# Patient Record
Sex: Female | Born: 1975 | Race: White | Hispanic: No | Marital: Married | Smoking: Never smoker
Health system: Southern US, Community
[De-identification: ages and names within clinical notes are randomized; demographics above are authoritative.]

---

## 2005-05-25 ENCOUNTER — Emergency Department: Payer: Self-pay | Admitting: Emergency Medicine

## 2005-05-26 ENCOUNTER — Emergency Department: Payer: Self-pay | Admitting: Emergency Medicine

## 2005-05-26 ENCOUNTER — Other Ambulatory Visit: Payer: Self-pay

## 2007-01-12 ENCOUNTER — Encounter: Admission: RE | Admit: 2007-01-12 | Discharge: 2007-01-12 | Payer: Self-pay | Admitting: Dermatology

## 2008-09-10 ENCOUNTER — Ambulatory Visit: Payer: Self-pay

## 2008-09-24 ENCOUNTER — Ambulatory Visit: Payer: Self-pay

## 2009-03-25 ENCOUNTER — Ambulatory Visit: Payer: Self-pay

## 2010-04-21 ENCOUNTER — Ambulatory Visit: Payer: Self-pay

## 2012-10-26 ENCOUNTER — Ambulatory Visit: Payer: Self-pay | Admitting: Obstetrics & Gynecology

## 2016-09-02 ENCOUNTER — Other Ambulatory Visit: Payer: Self-pay | Admitting: Family Medicine

## 2016-09-02 DIAGNOSIS — Z1231 Encounter for screening mammogram for malignant neoplasm of breast: Secondary | ICD-10-CM

## 2016-09-28 ENCOUNTER — Encounter: Payer: Self-pay | Admitting: Radiology

## 2016-09-28 ENCOUNTER — Ambulatory Visit
Admission: RE | Admit: 2016-09-28 | Discharge: 2016-09-28 | Disposition: A | Discharge status: Home / Self Care | Payer: Managed Care, Other (non HMO) | Source: Ambulatory Visit | Attending: Family Medicine | Admitting: Family Medicine

## 2016-09-28 DIAGNOSIS — Z1231 Encounter for screening mammogram for malignant neoplasm of breast: Secondary | ICD-10-CM | POA: Insufficient documentation

## 2016-10-02 ENCOUNTER — Other Ambulatory Visit: Payer: Self-pay | Admitting: *Deleted

## 2016-10-02 ENCOUNTER — Inpatient Hospital Stay
Admission: RE | Admit: 2016-10-02 | Discharge: 2016-10-02 | Disposition: A | Discharge status: Home / Self Care | Payer: Self-pay | Source: Ambulatory Visit | Attending: *Deleted | Admitting: *Deleted

## 2016-10-02 DIAGNOSIS — Z9289 Personal history of other medical treatment: Secondary | ICD-10-CM

## 2017-09-06 ENCOUNTER — Other Ambulatory Visit: Payer: Self-pay | Admitting: Family Medicine

## 2017-09-06 ENCOUNTER — Other Ambulatory Visit (HOSPITAL_COMMUNITY): Payer: Self-pay | Admitting: Family Medicine

## 2017-09-06 DIAGNOSIS — E049 Nontoxic goiter, unspecified: Secondary | ICD-10-CM

## 2017-09-13 ENCOUNTER — Ambulatory Visit
Admission: RE | Admit: 2017-09-13 | Discharge: 2017-09-13 | Disposition: A | Discharge status: Home / Self Care | Payer: Managed Care, Other (non HMO) | Source: Ambulatory Visit | Attending: Family Medicine | Admitting: Family Medicine

## 2017-09-13 DIAGNOSIS — E049 Nontoxic goiter, unspecified: Secondary | ICD-10-CM | POA: Insufficient documentation

## 2017-11-17 ENCOUNTER — Other Ambulatory Visit: Payer: Self-pay | Admitting: Pediatrics

## 2017-11-17 DIAGNOSIS — Z1231 Encounter for screening mammogram for malignant neoplasm of breast: Secondary | ICD-10-CM

## 2017-11-29 ENCOUNTER — Ambulatory Visit
Admission: RE | Admit: 2017-11-29 | Discharge: 2017-11-29 | Disposition: A | Discharge status: Home / Self Care | Payer: Managed Care, Other (non HMO) | Source: Ambulatory Visit | Attending: Pediatrics | Admitting: Pediatrics

## 2017-11-29 DIAGNOSIS — Z1231 Encounter for screening mammogram for malignant neoplasm of breast: Secondary | ICD-10-CM | POA: Diagnosis not present

## 2018-11-23 ENCOUNTER — Telehealth: Payer: Self-pay | Admitting: Obstetrics & Gynecology

## 2018-11-23 NOTE — Telephone Encounter (Signed)
[facility] referring Abnormal pap HGSIL, CIN2. Called and left voicemail for patient to call back to be schedule

## 2018-12-07 ENCOUNTER — Telehealth: Payer: Self-pay

## 2018-12-07 NOTE — Telephone Encounter (Signed)
Pt calling; is scheduled for LEEP Mon; started period today; can LEEP still be done or does she need to reschedule?  [phone]

## 2018-12-08 NOTE — Telephone Encounter (Signed)
Pt should not come in for a LEEP on her period this needs to be rescheduled please per [facility]

## 2018-12-12 ENCOUNTER — Ambulatory Visit: Payer: Self-pay | Admitting: Obstetrics and Gynecology

## 2018-12-12 NOTE — Telephone Encounter (Signed)
Patient is reschedule for Colpo appointment 01/09/19

## 2019-01-09 ENCOUNTER — Ambulatory Visit (INDEPENDENT_AMBULATORY_CARE_PROVIDER_SITE_OTHER): Payer: Managed Care, Other (non HMO) | Admitting: Obstetrics and Gynecology

## 2019-01-09 ENCOUNTER — Other Ambulatory Visit: Payer: Self-pay

## 2019-01-09 ENCOUNTER — Other Ambulatory Visit (HOSPITAL_COMMUNITY)
Admission: RE | Admit: 2019-01-09 | Discharge: 2019-01-09 | Disposition: A | Discharge status: Home / Self Care | Payer: Managed Care, Other (non HMO) | Source: Ambulatory Visit | Attending: Obstetrics and Gynecology | Admitting: Obstetrics and Gynecology

## 2019-01-09 ENCOUNTER — Encounter: Payer: Self-pay | Admitting: Obstetrics and Gynecology

## 2019-01-09 VITALS — BP 120/80 | HR 75 | Ht 63.0 in | Wt 169.0 lb

## 2019-01-09 DIAGNOSIS — N870 Mild cervical dysplasia: Secondary | ICD-10-CM | POA: Diagnosis not present

## 2019-01-09 DIAGNOSIS — R87613 High grade squamous intraepithelial lesion on cytologic smear of cervix (HGSIL): Secondary | ICD-10-CM

## 2019-01-09 NOTE — Progress Notes (Signed)
   GYNECOLOGY CLINIC COLPOSCOPY PROCEDURE NOTE  43 y.o. [id number] here for colposcopy for high-grade squamous intraepithelial neoplasia  (HGSIL-encompassing moderate and severe dysplasia)  pap smear on 11/14/2018. Discussed underlying role for HPV infection in the development of cervical dysplasia, its natural history and progression/regression, need for surveillance.  Is the patient  pregnant: No LMP: Patient's last menstrual period was 01/05/2019 (exact date). Smoking status:  reports that she has never smoked. She has never used smokeless tobacco. Contraception: none, withdrawl Future fertility desired:  No  Patient given informed consent, signed copy in the chart, time out was performed.  The patient was position in dorsal lithotomy position. Speculum was placed the cervix was visualized.   After application of acetic acid and then lugols solution colposcopic inspection of the cervix was undertaken.   Colposcopy adequate, full visualization of transformation zone: No abnormal vessels noted at 12 o'clock; corresponding biopsies obtained.   ECC specimen obtained:  Yes  All specimens were labeled and sent to pathology.   Patient was given post procedure instructions.  Will follow up pathology and manage accordingly.  Routine preventative health maintenance measures emphasized.  Physical Exam Genitourinary:        Genitourinary Comments: Small vessel at 12 o'clock     [place], [facility] 01/09/2019 10:13 AM

## 2019-01-09 NOTE — Patient Instructions (Signed)
Colposcopy, Care After This sheet gives you information about how to care for yourself after your procedure. Your doctor may also give you more specific instructions. If you have problems or questions, contact your doctor. What can I expect after the procedure? If you did not have a tissue sample removed (did not have a biopsy), you may only have some spotting for a few days. You can go back to your normal activities. If you had a tissue sample removed, it is common to have:  Soreness and pain. This may last for a few days.  Light-headedness.  Mild bleeding from your vagina or dark-colored, grainy discharge from your vagina. This may last for a few days. You may need to wear a sanitary pad.  Spotting for at least 48 hours after the procedure. Follow these instructions at home:   Take over-the-counter and prescription medicines only as told by your doctor. Ask your doctor what medicines you can start taking again. This is very important if you take blood-thinning medicine.  Do not drive or use heavy machinery while taking prescription pain medicine.  For 3 days, or as long as your doctor tells you, avoid: ? Douching. ? Using tampons. ? Having sex.  If you use birth control (contraception), keep using it.  Limit activity for the first day after the procedure. Ask your doctor what activities are safe for you.  It is up to you to get the results of your procedure. Ask your doctor when your results will be ready.  Keep all follow-up visits as told by your doctor. This is important. Contact a doctor if:  You get a skin rash. Get help right away if:  You are bleeding a lot from your vagina. It is a lot of bleeding if you are using more than one pad an hour for 2 hours in a row.  You have clumps of blood (blood clots) coming from your vagina.  You have a fever.  You have chills  You have pain in your lower belly (pelvic area).  You have signs of infection, such as vaginal  discharge that is: ? Different than usual. ? Yellow. ? Bad-smelling.  You have very pain or cramps in your lower belly that do not get better with medicine.  You feel light-headed.  You feel dizzy.  You pass out (faint). Summary  If you did not have a tissue sample removed (did not have a biopsy), you may only have some spotting for a few days. You can go back to your normal activities.  If you had a tissue sample removed, it is common to have mild pain and spotting for 48 hours.  For 3 days, or as long as your doctor tells you, avoid douching, using tampons and having sex.  Get help right away if you have bleeding, very bad pain, or signs of infection. This information is not intended to replace advice given to you by your health care provider. Make sure you discuss any questions you have with your health care provider. Document Released: 11/18/2007 Document Revised: 05/14/2017 Document Reviewed: 02/19/2016 Elsevier Patient Education  2020 Elsevier Inc.  

## 2019-01-12 NOTE — Progress Notes (Signed)
Will need pap smear in 12 and 24 months. Called 01/12/2019 and left message

## 2019-01-13 ENCOUNTER — Telehealth: Payer: Self-pay

## 2019-01-13 NOTE — Telephone Encounter (Signed)
Called and left message. Did not answer.

## 2019-01-13 NOTE — Telephone Encounter (Signed)
Pt calling Dr. Gilman Schmidt back about 01/09/2019 Test Results.

## 2019-01-19 ENCOUNTER — Other Ambulatory Visit: Payer: Self-pay | Admitting: Pediatrics

## 2019-01-19 DIAGNOSIS — Z1231 Encounter for screening mammogram for malignant neoplasm of breast: Secondary | ICD-10-CM

## 2019-04-10 ENCOUNTER — Ambulatory Visit
Admission: RE | Admit: 2019-04-10 | Discharge: 2019-04-10 | Disposition: A | Discharge status: Home / Self Care | Payer: Managed Care, Other (non HMO) | Source: Ambulatory Visit | Attending: Pediatrics | Admitting: Pediatrics

## 2019-04-10 DIAGNOSIS — Z1231 Encounter for screening mammogram for malignant neoplasm of breast: Secondary | ICD-10-CM | POA: Insufficient documentation

## 2020-10-02 ENCOUNTER — Other Ambulatory Visit: Payer: Self-pay | Admitting: Family Medicine

## 2020-10-02 ENCOUNTER — Other Ambulatory Visit: Payer: Self-pay | Admitting: Pediatrics

## 2020-10-02 DIAGNOSIS — Z1231 Encounter for screening mammogram for malignant neoplasm of breast: Secondary | ICD-10-CM

## 2020-11-04 ENCOUNTER — Ambulatory Visit
Admission: RE | Admit: 2020-11-04 | Discharge: 2020-11-04 | Disposition: A | Discharge status: Home / Self Care | Payer: Managed Care, Other (non HMO) | Source: Ambulatory Visit | Attending: Family Medicine | Admitting: Family Medicine

## 2020-11-04 ENCOUNTER — Other Ambulatory Visit: Payer: Self-pay

## 2020-11-04 DIAGNOSIS — Z1231 Encounter for screening mammogram for malignant neoplasm of breast: Secondary | ICD-10-CM | POA: Diagnosis not present

## 2021-03-31 ENCOUNTER — Other Ambulatory Visit: Payer: Self-pay

## 2021-03-31 ENCOUNTER — Other Ambulatory Visit (HOSPITAL_COMMUNITY)
Admission: RE | Admit: 2021-03-31 | Discharge: 2021-03-31 | Disposition: A | Discharge status: Home / Self Care | Payer: Managed Care, Other (non HMO) | Source: Ambulatory Visit | Attending: Obstetrics and Gynecology | Admitting: Obstetrics and Gynecology

## 2021-03-31 ENCOUNTER — Encounter: Payer: Self-pay | Admitting: Obstetrics and Gynecology

## 2021-03-31 ENCOUNTER — Ambulatory Visit: Payer: Managed Care, Other (non HMO) | Admitting: Obstetrics and Gynecology

## 2021-03-31 VITALS — BP 116/70 | Ht 63.0 in | Wt 179.4 lb

## 2021-03-31 DIAGNOSIS — E039 Hypothyroidism, unspecified: Secondary | ICD-10-CM | POA: Diagnosis not present

## 2021-03-31 DIAGNOSIS — D500 Iron deficiency anemia secondary to blood loss (chronic): Secondary | ICD-10-CM

## 2021-03-31 DIAGNOSIS — Z124 Encounter for screening for malignant neoplasm of cervix: Secondary | ICD-10-CM | POA: Insufficient documentation

## 2021-03-31 DIAGNOSIS — N921 Excessive and frequent menstruation with irregular cycle: Secondary | ICD-10-CM | POA: Diagnosis not present

## 2021-03-31 DIAGNOSIS — Z1211 Encounter for screening for malignant neoplasm of colon: Secondary | ICD-10-CM

## 2021-03-31 NOTE — Progress Notes (Signed)
Patient ID: Mary Snow, female   DOB: Jun 24, 1975, 45 y.o.   MRN: [id number]  Reason for Consult: consultion   Referred by Dione Housekeeper, *  Subjective:     HPI:  Mary Snow is a 45 y.o. female. She is here for consultation regarding menorrhagia. She reports that she has been having heavy bleeding since she was a teen. She reports that it was better after her pregnancy but then returned. She passes large clots. 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 She has throbbing pain in her legs during her menstrual cycle. She has a history of migraines.   Gynecological History  Patient's last menstrual period was 03/22/2021. Menarche: 33 Menopause:no   She reports passage of large clots She reports sensations of gushing or flooding of blood. She reports accidents where she bleeds through her clothing. She reports that she changes a saturated pad or tampon more frequently than every hour.  She reports that pain from her periods limits her activities.  History of fibroids, polyps, or ovarian cysts? : no  History of PCOS? no Hstory of Endometriosis? no History of abnormal pap smears? no Have you had any sexually transmitted infections in the past? no  She identifies as a female. She is sexually active with men.   She denies dyspareunia. She denies postcoital bleeding.   Obstetrical History OB History  Gravida Para Term Preterm AB Living  2 1 1   1 1   SAB IAB Ectopic Multiple Live Births    1     1    # Outcome Date GA Lbr Len/2nd Weight Sex Delivery Anes PTL Lv  2 IAB           1 Term              No past medical history on file. Family History  Problem Relation Age of Onset   Breast cancer Neg Hx    No past surgical history on file.  Short Social History:  Social History   Tobacco Use   Smoking status: Never   Smokeless tobacco: Never  Substance Use Topics    Alcohol use: Yes    Comment: social     Allergies  Allergen Reactions   Latex Itching    Current Outpatient Medications  Medication Sig Dispense Refill   magnesium oxide (MAG-OX) 400 MG tablet Take by mouth.     SUMAtriptan (IMITREX) 50 MG tablet TAKE 0.5 2 TABLETS BY MOUTH AS NEEDED FOR MIGRAINE UP TO 1 DOSE MAY REPEAT AFTER 2 HOURS IF NEEDED.     SYNTHROID 150 MCG tablet      No current facility-administered medications for this visit.    Review of Systems  Constitutional: Negative for chills, fatigue, fever and unexpected weight change.  HENT: Negative for trouble swallowing.  Eyes: Negative for loss of vision.  Respiratory: Negative for cough, shortness of breath and wheezing.  Cardiovascular: Negative for chest pain, leg swelling, palpitations and syncope.  GI: Negative for abdominal pain, blood in stool, diarrhea, nausea and vomiting.  GU: Negative for difficulty urinating, dysuria, frequency and hematuria.  Musculoskeletal: Negative for back pain, leg pain and joint pain.  Skin: Negative for rash.  Neurological: Negative for dizziness, headaches, light-headedness, numbness and seizures.  Psychiatric: Negative for behavioral problem, confusion, depressed mood and sleep disturbance.       Objective:  Objective  Vitals:   03/31/21 0911  BP: 116/70  Weight: 179 lb 6.4 oz (81.4 kg)  Height: 5\' 3"  (1.6 m)   Body mass index is 31.78 kg/m.  Physical Exam Vitals and nursing note reviewed. Exam conducted with a chaperone present.  Constitutional:      Appearance: Normal appearance. She is well-developed.  HENT:     Head: Normocephalic and atraumatic.  Eyes:     Extraocular Movements: Extraocular movements intact.     Pupils: Pupils are equal, round, and reactive to light.  Cardiovascular:     Rate and Rhythm: Normal rate and regular rhythm.  Pulmonary:     Effort: Pulmonary effort is normal. No respiratory distress.     Breath sounds: Normal breath sounds.   Abdominal:     General: Abdomen is flat.     Palpations: Abdomen is soft.  Genitourinary:    Comments: External: Normal appearing vulva. No lesions noted.  Speculum examination: Normal appearing cervix. No blood in the vaginal vault. No discharge.   Bimanual examination: Uterus midline, non-tender, 12 cm in size, slight irregular shape and contour.  No CMT. No adnexal masses. No adnexal tenderness. Pelvis not fixed.  Breast exam: exam not performed Musculoskeletal:        General: No signs of injury.  Skin:    General: Skin is warm and dry.  Neurological:     Mental Status: She is alert and oriented to person, place, and time.  Psychiatric:        Behavior: Behavior normal.        Thought Content: Thought content normal.        Judgment: Judgment normal.    Assessment/Plan:    45 yo with menorrhagia Follow up for labs- anemia and thyroid assessment.  Pelvic 57 ordered Referral to GI for colon cancer screening Pap smear today  More than 20 minutes were spent face to face with the patient in the room, reviewing the medical record, labs and images, and coordinating care for the patient. The plan of management was discussed in detail and counseling was provided.    [place] MD [facility], [facility] 03/31/2021 9:50 AM

## 2021-03-31 NOTE — Patient Instructions (Signed)
Iron-Rich Diet  Iron is a mineral that helps your body produce hemoglobin. Hemoglobin is a protein in red blood cells that carries oxygen to your body's tissues. Eating too little iron may cause you to feel weak and tired, and it can increase your risk of infection. Iron is naturally found in many foods, and many foods have iron added to them (are iron-fortified). You may need to follow an iron-rich diet if you do not have enough iron in your body due to certain medical conditions. The amount of iron that you need each day depends on your age, your sex, and any medical conditions you have. Follow instructions from your health care provider or a dietitian about how much ironyou should eat each day. What are tips for following this plan? Reading food labels Check food labels to see how many milligrams (mg) of iron are in each serving. Cooking Cook foods in pots and pans that are made from iron. Take these steps to make it easier for your body to absorb iron from certain foods: Soak beans overnight before cooking. Soak whole grains overnight and drain them before using. Ferment flours before baking, such as by using yeast in bread dough. Meal planning When you eat foods that contain iron, you should eat them with foods that are high in vitamin C. These include oranges, peppers, tomatoes, potatoes, and mangoes. Vitamin C helps your body absorb iron. Certain foods and drinks prevent your body from absorbing iron properly. Avoid eating these foods in the same meal as iron-rich foods or with iron supplements. These foods include: Coffee, black tea, and red wine. Milk, dairy products, and foods that are high in calcium. Beans and soybeans. Whole grains. General information Take iron supplements only as told by your health care provider. An overdose of iron can be life-threatening. If you were prescribed iron supplements, take them with orange juice or a vitamin C supplement. When you eat iron-fortified  foods or take an iron supplement, you should also eat foods that naturally contain iron, such as meat, poultry, and fish. Eating naturally iron-rich foods helps your body absorb the iron that is added to other foods or contained in a supplement. Iron from animal sources is better absorbed than iron from plant sources. What foods should I eat? Fruits Prunes. Raisins. Eat fruits high in vitamin C, such as oranges, grapefruits, and strawberries,with iron-rich foods. Vegetables Spinach (cooked). Green peas. Broccoli. Fermented vegetables. Eat vegetables high in vitamin C, such as leafy greens, potatoes, bell peppers,and tomatoes, with iron-rich foods. Grains Iron-fortified breakfast cereal. Iron-fortified whole-wheat bread. Enrichedrice. Sprouted grains. Meats and other proteins Beef liver. Beef. Turkey. Chicken. Oysters. Shrimp. Tuna. Sardines. Chickpeas.Nuts. Tofu. Pumpkin seeds. Beverages Tomato juice. Fresh orange juice. Prune juice. Hibiscus tea. Iron-fortifiedinstant breakfast shakes. Sweets and desserts Blackstrap molasses. Seasonings and condiments Tahini. Fermented soy sauce. Other foods Wheat germ. The items listed above may not be a complete list of recommended foods and beverages. Contact a dietitian for more information. What foods should I limit? These are foods that should be limited while eating iron-rich foods as they canreduce the absorption of iron in your body. Grains Whole grains. Bran cereal. Bran flour. Meats and other proteins Soybeans. Products made from soy protein. Black beans. Lentils. Mung beans.Split peas. Dairy Milk. Cream. Cheese. Yogurt. Cottage cheese. Beverages Coffee. Black tea. Red wine. Sweets and desserts Cocoa. Chocolate. Ice cream. Seasonings and condiments Basil. Oregano. Large amounts of parsley. The items listed above may not be a complete list of   foods and beverages you should limit. Contact a dietitian for more information. Summary Iron  is a mineral that helps your body produce hemoglobin. Hemoglobin is a protein in red blood cells that carries oxygen to your body's tissues. Iron is naturally found in many foods, and many foods have iron added to them (are iron-fortified). When you eat foods that contain iron, you should eat them with foods that are high in vitamin C. Vitamin C helps your body absorb iron. Certain foods and drinks prevent your body from absorbing iron properly, such as whole grains and dairy products. You should avoid eating these foods in the same meal as iron-rich foods or with iron supplements. This information is not intended to replace advice given to you by your health care provider. Make sure you discuss any questions you have with your healthcare provider. Document Revised: 05/13/2020 Document Reviewed: 05/13/2020 Elsevier Patient Education  2022 Elsevier Inc.  

## 2021-04-01 LAB — CBC WITH DIFFERENTIAL
Basophils Absolute: 0.1 10*3/uL (ref 0.0–0.2)
Basos: 1 %
EOS (ABSOLUTE): 0.2 10*3/uL (ref 0.0–0.4)
Eos: 4 %
Hematocrit: 39.3 % (ref 34.0–46.6)
Hemoglobin: 12.6 g/dL (ref 11.1–15.9)
Immature Grans (Abs): 0 10*3/uL (ref 0.0–0.1)
Immature Granulocytes: 1 %
Lymphocytes Absolute: 1.4 10*3/uL (ref 0.7–3.1)
Lymphs: 22 %
MCH: 28.2 pg (ref 26.6–33.0)
MCHC: 32.1 g/dL (ref 31.5–35.7)
MCV: 88 fL (ref 79–97)
Monocytes Absolute: 0.4 10*3/uL (ref 0.1–0.9)
Monocytes: 6 %
Neutrophils Absolute: 4.3 10*3/uL (ref 1.4–7.0)
Neutrophils: 66 %
RBC: 4.47 x10E6/uL (ref 3.77–5.28)
RDW: 13.8 % (ref 11.7–15.4)
WBC: 6.4 10*3/uL (ref 3.4–10.8)

## 2021-04-01 LAB — VITAMIN B12: Vitamin B-12: 243 pg/mL (ref 232–1245)

## 2021-04-01 LAB — IRON AND TIBC
Iron Saturation: 18 % (ref 15–55)
Iron: 58 ug/dL (ref 27–159)
Total Iron Binding Capacity: 319 ug/dL (ref 250–450)
UIBC: 261 ug/dL (ref 131–425)

## 2021-04-01 LAB — FERRITIN: Ferritin: 12 ng/mL — ABNORMAL LOW (ref 15–150)

## 2021-04-01 LAB — TSH: TSH: 1.49 u[IU]/mL (ref 0.450–4.500)

## 2021-04-02 LAB — CYTOLOGY - PAP
Comment: NEGATIVE
Diagnosis: NEGATIVE
High risk HPV: NEGATIVE

## 2021-04-09 ENCOUNTER — Ambulatory Visit: Payer: Managed Care, Other (non HMO)

## 2021-04-14 ENCOUNTER — Ambulatory Visit
Admission: RE | Admit: 2021-04-14 | Discharge: 2021-04-14 | Disposition: A | Discharge status: Home / Self Care | Payer: Managed Care, Other (non HMO) | Source: Ambulatory Visit | Attending: Obstetrics and Gynecology | Admitting: Obstetrics and Gynecology

## 2021-04-14 ENCOUNTER — Other Ambulatory Visit: Payer: Self-pay

## 2021-04-14 DIAGNOSIS — N921 Excessive and frequent menstruation with irregular cycle: Secondary | ICD-10-CM | POA: Insufficient documentation

## 2021-04-28 ENCOUNTER — Ambulatory Visit (INDEPENDENT_AMBULATORY_CARE_PROVIDER_SITE_OTHER): Payer: Managed Care, Other (non HMO) | Admitting: Obstetrics and Gynecology

## 2021-04-28 ENCOUNTER — Encounter: Payer: Self-pay | Admitting: Obstetrics and Gynecology

## 2021-04-28 ENCOUNTER — Other Ambulatory Visit: Payer: Self-pay

## 2021-04-28 VITALS — BP 118/70 | Ht 63.0 in | Wt 182.0 lb

## 2021-04-28 DIAGNOSIS — E039 Hypothyroidism, unspecified: Secondary | ICD-10-CM

## 2021-04-28 MED ORDER — SYNTHROID 150 MCG PO TABS: 150.0000 ug | ORAL_TABLET | Freq: Every day | ORAL | 11 refills | Status: DC

## 2021-04-28 NOTE — Patient Instructions (Signed)
Endometrial Ablation Endometrial ablation is a procedure that destroys the thin inner layer of the lining of the uterus (endometrium). This procedure may be done: To stop heavy menstrual periods. To stop bleeding that is causing anemia. To control irregular bleeding. To treat bleeding caused by small tumors (fibroids) in the endometrium. This procedure is often done as an alternative to major surgery, such as removal of the uterus and cervix (hysterectomy). As a result of this procedure: You may not be able to have children. However, if you have not yet gone through menopause: You may still have a small chance of getting pregnant. You will need to use a reliable method of birth control after the procedure to prevent pregnancy. You may stop having a menstrual period, or you may have only a small amount of bleeding during your period. Menstruation may return several years after the procedure. Tell a health care provider about: Any allergies you have. All medicines you are taking, including vitamins, herbs, eye drops, creams, and over-the-counter medicines. Any problems you or family members have had with the use of anesthetic medicines. Any blood disorders you have. Any surgeries you have had. Any medical conditions you have. Whether you are pregnant or may be pregnant. What are the risks? Generally, this is a safe procedure. However, problems may occur, including: A hole (perforation) in the uterus or bowel. Infection in the uterus, bladder, or vagina. Bleeding. Allergic reaction to medicines. Damage to nearby structures or organs. An air bubble in the lung (air embolus). Problems with pregnancy. Failure of the procedure. Decreased ability to diagnose cancer in the endometrium. Scar tissue forms after the procedure, making it more difficult to get a sample of the uterine lining. What happens before the procedure? Medicines Ask your health care provider about: Changing or stopping your  regular medicines. This is especially important if you take diabetes medicines or blood thinners. Taking medicines such as aspirin and ibuprofen. These medicines can thin your blood. Do not take these medicines before your procedure if your doctor tells you not to take them. Taking over-the-counter medicines, vitamins, herbs, and supplements. Tests You will have tests of your endometrium to make sure there are no precancerous cells or cancer cells present. You may have an ultrasound of the uterus. General instructions Do not use any products that contain nicotine or tobacco for at least 4 weeks before the procedure. These include cigarettes, chewing tobacco, and vaping devices, such as e-cigarettes. If you need help quitting, ask your health care provider. You may be given medicines to thin the endometrium. Ask your health care provider what steps will be taken to help prevent infection. These steps may include: Removing hair at the surgery site. Washing skin with a germ-killing soap. Taking antibiotic medicine. Plan to have a responsible adult take you home from the hospital or clinic. Plan to have a responsible adult care for you for the time you are told after you leave the hospital or clinic. This is important. What happens during the procedure?  You will lie on an exam table with your feet and legs supported as in a pelvic exam. An IV will be inserted into one of your veins. You will be given a medicine to help you relax (sedative). A surgical tool with a light and camera (resectoscope) will be inserted into your vagina and moved into your uterus. This allows your surgeon to see inside your uterus. Endometrial tissue will be destroyed and removed, using one of the following methods: Radiofrequency. This   uses an electrical current to destroy the endometrium. Cryotherapy. This uses extreme cold to freeze the endometrium. Heated fluid. This uses a heated salt and water (saline) solution to  destroy the endometrium. Microwave. This uses high-energy microwaves to heat up the endometrium and destroy it. Thermal balloon. This involves inserting a catheter with a balloon tip into the uterus. The balloon tip is filled with heated fluid to destroy the endometrium. The procedure may vary among health care providers and hospitals. What happens after the procedure? Your blood pressure, heart rate, breathing rate, and blood oxygen level will be monitored until you leave the hospital or clinic. You may have vaginal bleeding for 4-6 weeks after the procedure. You may also have: Cramps. A thin, watery vaginal discharge that is light pink or brown. A need to urinate more than usual. Nausea. If you were given a sedative during the procedure, it can affect you for several hours. Do not drive or operate machinery until your health care provider says that it is safe. Do not have sex or insert anything into your vagina until your health care provider says it is safe. Summary Endometrial ablation is done to treat many causes of heavy menstrual bleeding. The procedure destroys the thin inner layer of the lining of the uterus (endometrium). This procedure is often done as an alternative to major surgery, such as removal of the uterus and cervix (hysterectomy). Plan to have a responsible adult take you home from the hospital or clinic. This information is not intended to replace advice given to you by your health care provider. Make sure you discuss any questions you have with your health care provider. Document Revised: 12/21/2019 Document Reviewed: 12/21/2019 Elsevier Patient Education  2022 Elsevier Inc.  

## 2021-04-28 NOTE — Progress Notes (Signed)
Patient ID: Mary Snow, female   DOB: [birth year], 45 y.o.   MRN: [id number]  Reason for Consult: No chief complaint on file.   Referred by Valera Castle, *  Subjective:     HPI:  Mary Snow is a 45 y.o. female patient is following up today after renal ultrasound to review results.  She is regular monthly however she sometimes has extended periods for about 2 weeks.  She has heavy acid as well as has significant pain during her menstrual cycle.  She reports pain that radiates down the tops of her thighs.  Gynecological History  Patient's last menstrual period was 04/16/2021.  No past medical history on file. Family History  Problem Relation Age of Onset   Breast cancer Neg Hx    No past surgical history on file.  Short Social History:  Social History   Tobacco Use   Smoking status: Never   Smokeless tobacco: Never  Substance Use Topics   Alcohol use: Yes    Comment: social     Allergies  Allergen Reactions   Latex Itching    Current Outpatient Medications  Medication Sig Dispense Refill   magnesium oxide (MAG-OX) 400 MG tablet Take by mouth.     SUMAtriptan (IMITREX) 50 MG tablet TAKE 0.5 2 TABLETS BY MOUTH AS NEEDED FOR MIGRAINE UP TO 1 DOSE MAY REPEAT AFTER 2 HOURS IF NEEDED.     SYNTHROID 150 MCG tablet Take 1 tablet (150 mcg total) by mouth daily before breakfast. 30 tablet 11   No current facility-administered medications for this visit.    Review of Systems  Constitutional: Negative for chills, fatigue, fever and unexpected weight change.  HENT: Negative for trouble swallowing.  Eyes: Negative for loss of vision.  Respiratory: Negative for cough, shortness of breath and wheezing.  Cardiovascular: Negative for chest pain, leg swelling, palpitations and syncope.  GI: Negative for abdominal pain, blood in stool, diarrhea, nausea and vomiting.  GU: Negative for difficulty urinating, dysuria, frequency and hematuria.  Musculoskeletal: Negative  for back pain, leg pain and joint pain.  Skin: Negative for rash.  Neurological: Negative for dizziness, headaches, light-headedness, numbness and seizures.  Psychiatric: Negative for behavioral problem, confusion, depressed mood and sleep disturbance.       Objective:  Objective   Vitals:   04/28/21 0957  BP: 118/70  Weight: 182 lb (82.6 kg)  Height: 5\' 3"  (1.6 m)   Body mass index is 32.24 kg/m.  Physical Exam Vitals and nursing note reviewed. Exam conducted with a chaperone present.  Constitutional:      Appearance: Normal appearance.  HENT:     Head: Normocephalic and atraumatic.  Eyes:     Extraocular Movements: Extraocular movements intact.     Pupils: Pupils are equal, round, and reactive to light.  Cardiovascular:     Rate and Rhythm: Normal rate and regular rhythm.  Pulmonary:     Effort: Pulmonary effort is normal.     Breath sounds: Normal breath sounds.  Abdominal:     General: Abdomen is flat.     Palpations: Abdomen is soft.  Musculoskeletal:     Cervical back: Normal range of motion.  Skin:    General: Skin is warm and dry.  Neurological:     General: No focal deficit present.     Mental Status: She is alert and oriented to person, place, and time.  Psychiatric:        Behavior: Behavior normal.  Thought Content: Thought content normal.        Judgment: Judgment normal.    Assessment/Plan:    45 yo with menorrhagia We reviewed options for management of menorrhagia.  We do IUD, endometrial endometrial ablation as well as hysterectomy.  At this time patient is undecided regarding her options.  Recommended starting with IUD and escalating as necessary to evaluate her bleeding.  Patient information regarding IUD and endometrial ablation.  She will continue to consider these options and follow-up when she is ready to go forward with treatment plan.  Additionally she requested a prescription refill for her Synthroid.  She reports that her  endocrinologist is now systemic and will need a refill.  Refill medication.  Patient reports that she has been controlled on 150 mcg of Synthroid for several years and generally was checking her TSH last year with her endocrinologist.  She notes that she started iron.  She is having some GI upset but like to trial this medicine every other day.  Discussed that this is reasonable.  Asked her to let us know if she is not having improvement of the GI upset not tolerating iron supplement in which case she could be considered for referral for IV iron therapy.  More than 20 minutes were spent face to face with the patient in the room, reviewing the medical record, labs and images, and coordinating care for the patient. The plan of management was discussed in detail and counseling was provided.   Adelene Idler MD [facility], [facility] 04/28/2021 10:25 AM

## 2021-07-27 ENCOUNTER — Other Ambulatory Visit: Payer: Self-pay | Admitting: Obstetrics and Gynecology

## 2021-07-27 DIAGNOSIS — E039 Hypothyroidism, unspecified: Secondary | ICD-10-CM

## 2021-10-02 ENCOUNTER — Other Ambulatory Visit: Payer: Self-pay | Admitting: Family Medicine

## 2021-10-02 DIAGNOSIS — Z1231 Encounter for screening mammogram for malignant neoplasm of breast: Secondary | ICD-10-CM

## 2021-12-15 ENCOUNTER — Ambulatory Visit
Admission: RE | Admit: 2021-12-15 | Discharge: 2021-12-15 | Disposition: A | Discharge status: Home / Self Care | Payer: Managed Care, Other (non HMO) | Source: Ambulatory Visit | Attending: Family Medicine | Admitting: Family Medicine

## 2021-12-15 DIAGNOSIS — Z1231 Encounter for screening mammogram for malignant neoplasm of breast: Secondary | ICD-10-CM | POA: Diagnosis present

## 2022-12-22 IMAGING — MG MM DIGITAL SCREENING BILAT W/ TOMO AND CAD
8 series · 8 of 24 positions shown · non-contrast
Comparison: Previous exam(s).

CLINICAL DATA: Screening.

EXAM:
DIGITAL SCREENING BILATERAL MAMMOGRAM WITH TOMOSYNTHESIS AND CAD
TECHNIQUE: Bilateral screening digital craniocaudal and mediolateral oblique
mammograms were obtained. Bilateral screening digital breast
tomosynthesis was performed. The images were evaluated with
computer-aided detection.

[R MLO synth-2D]
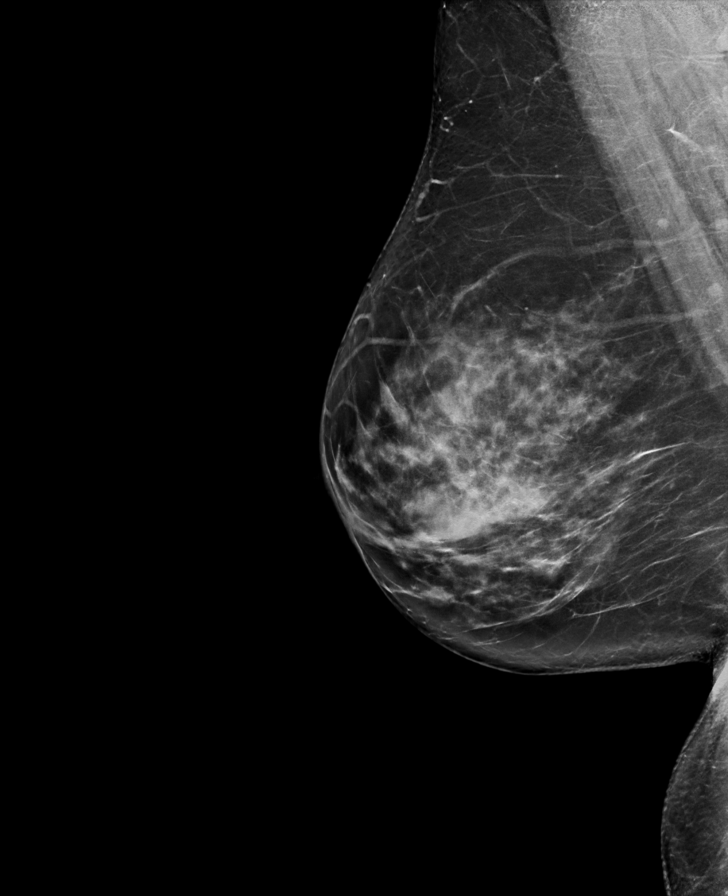

[L CC synth-2D]
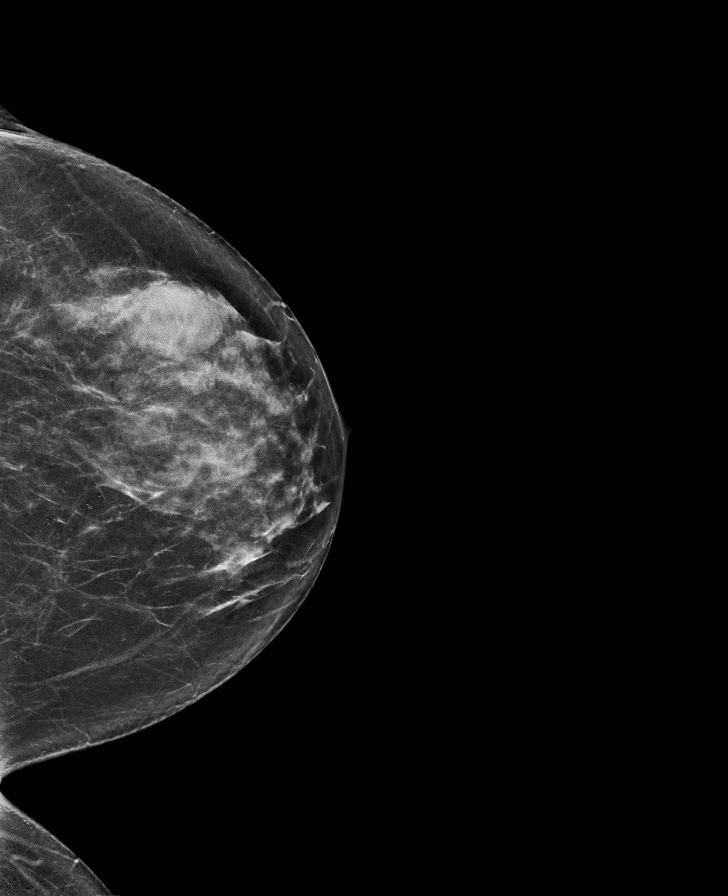

[R CC synth-2D]
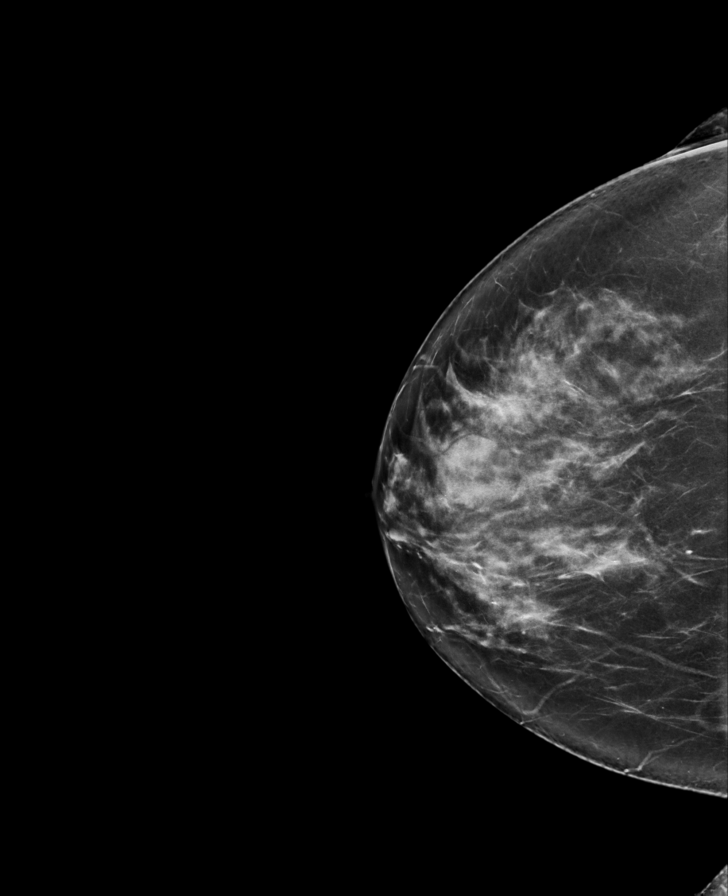

[L MLO synth-2D]
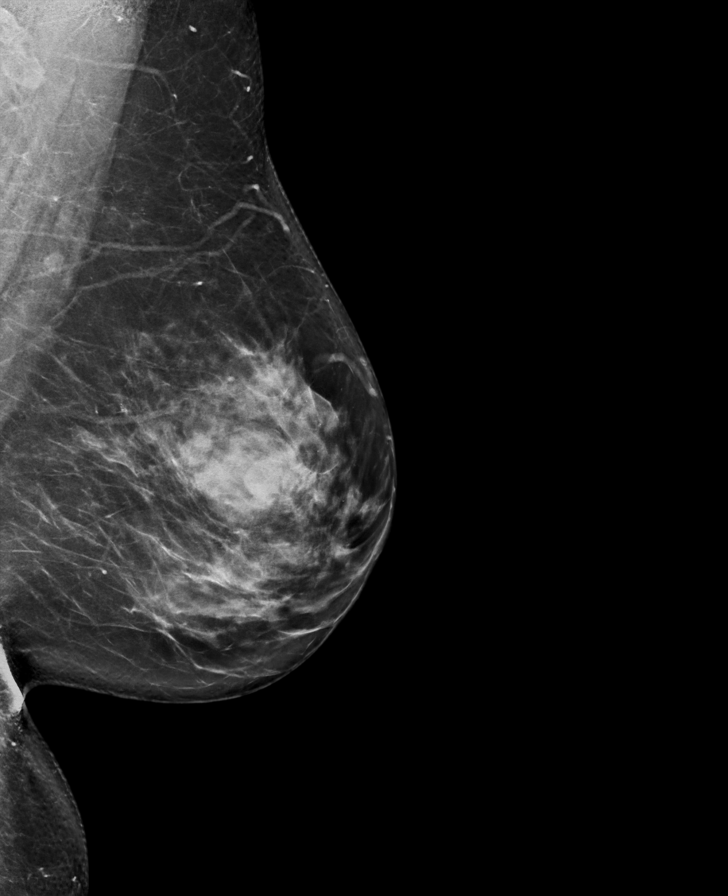

[L MLO tomo · tomo slice 42/83.0]
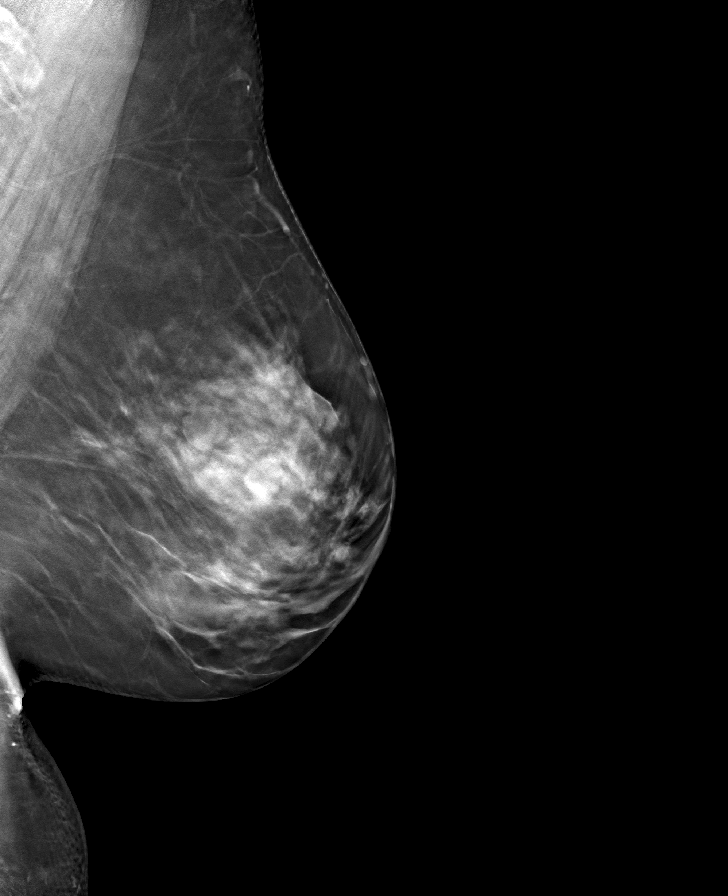

[R CC tomo · tomo slice 39/78.0]
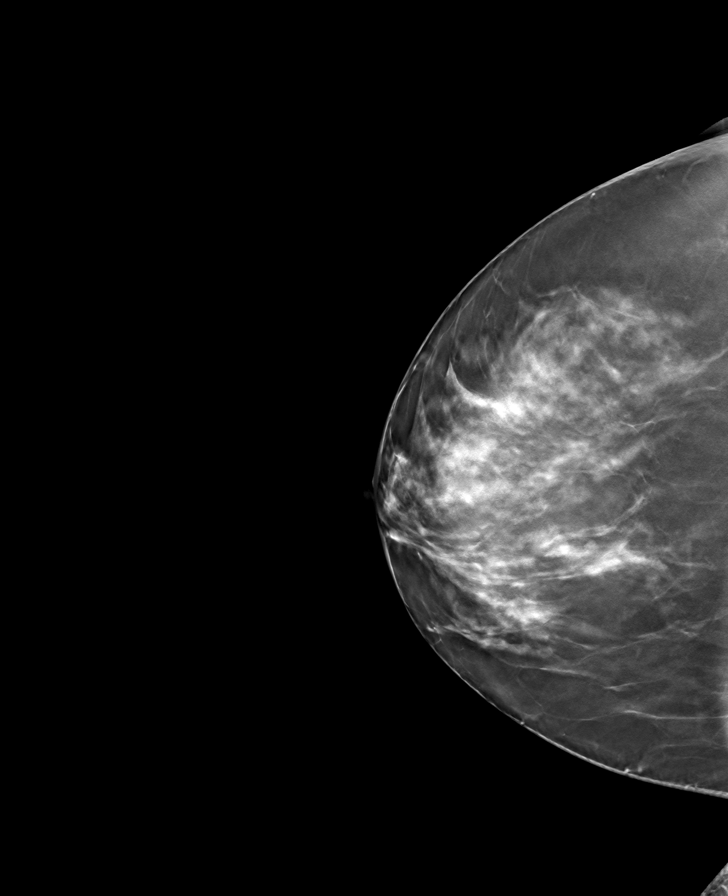

[L CC tomo · tomo slice 37/73.0]
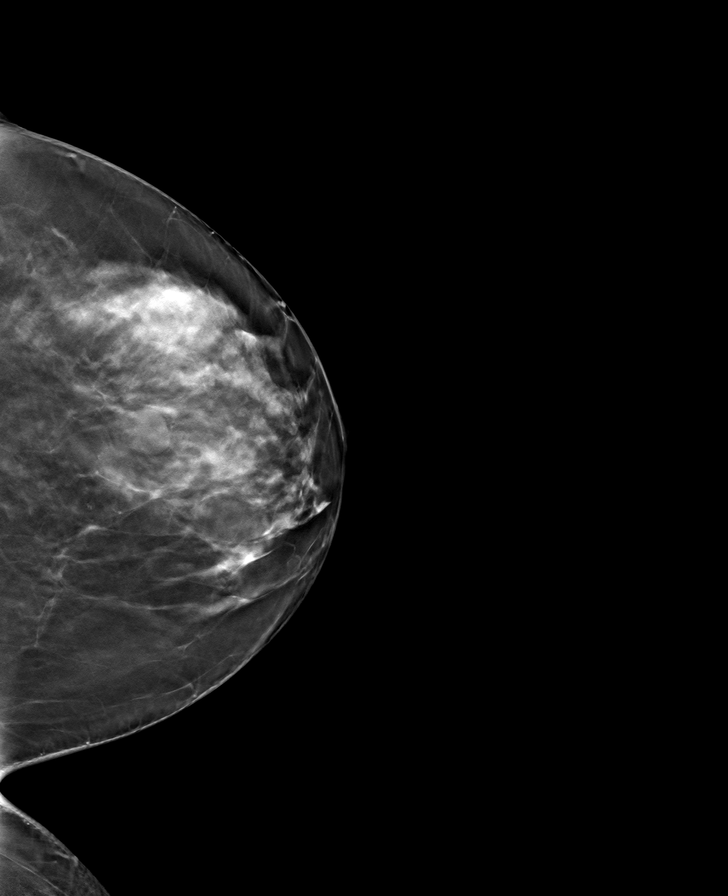

[R MLO tomo · tomo slice 41/81.0]
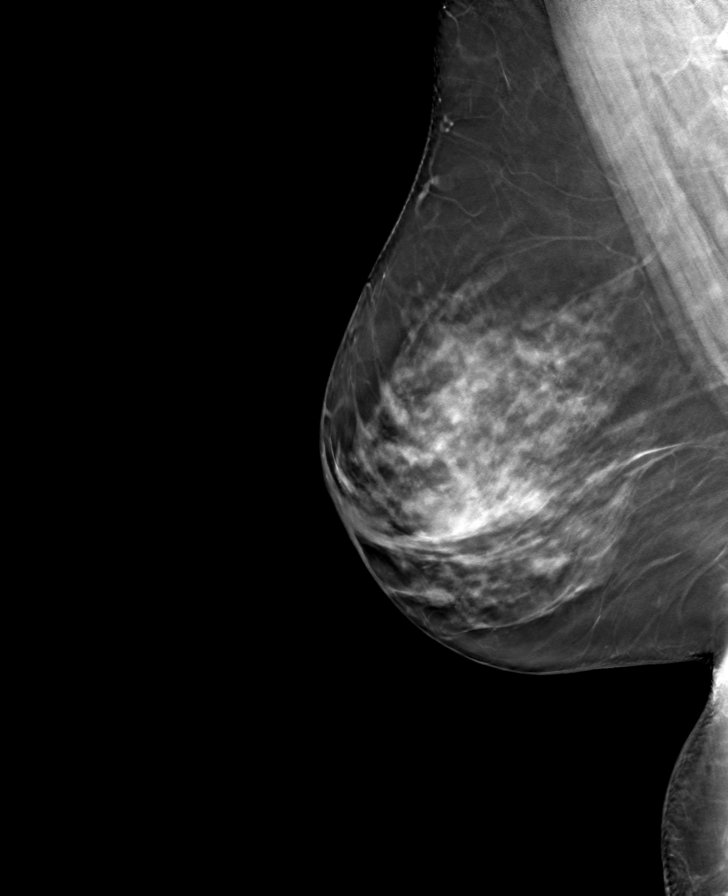

[8 of 24 positions shown; findings below may reference images not displayed]

ACR Breast Density Category c: The breast tissue is heterogeneously
dense, which may obscure small masses.
FINDINGS: There are no findings suspicious for malignancy. The images were
evaluated with computer-aided detection.
IMPRESSION: No mammographic evidence of malignancy. A result letter of this
screening mammogram will be mailed directly to the patient.

RECOMMENDATION:
Screening mammogram in one year. (Code:T4-5-GWO)

BI-RADS CATEGORY  1: Negative.

## 2023-01-25 ENCOUNTER — Other Ambulatory Visit: Payer: Self-pay | Admitting: Family Medicine

## 2023-01-25 DIAGNOSIS — Z1231 Encounter for screening mammogram for malignant neoplasm of breast: Secondary | ICD-10-CM

## 2023-03-01 ENCOUNTER — Ambulatory Visit: Payer: Managed Care, Other (non HMO) | Admitting: Podiatry

## 2023-03-01 ENCOUNTER — Encounter: Payer: Self-pay | Admitting: Podiatry

## 2023-03-01 ENCOUNTER — Ambulatory Visit
Admission: RE | Admit: 2023-03-01 | Discharge: 2023-03-01 | Disposition: A | Discharge status: Home / Self Care | Payer: Managed Care, Other (non HMO) | Source: Ambulatory Visit | Attending: Family Medicine | Admitting: Family Medicine

## 2023-03-01 VITALS — BP 120/76 | HR 72

## 2023-03-01 DIAGNOSIS — B070 Plantar wart: Secondary | ICD-10-CM | POA: Diagnosis not present

## 2023-03-01 DIAGNOSIS — Z1231 Encounter for screening mammogram for malignant neoplasm of breast: Secondary | ICD-10-CM | POA: Diagnosis present

## 2023-03-01 NOTE — Progress Notes (Signed)
Subjective:  Patient ID: Mary Snow, female    DOB: 1976-03-31,  MRN: [id number]  Chief Complaint  Patient presents with   Callouses    "I have these corn things on my left foot.  I also have one on my right foot." N - corns L - Hallux, 1,2 - left; 4,5 - right D - 5 years O - gradually worse C - sharp pain, stabbing pain A - pressure, standing, barefoot T - file them down, medicated corn pads    47 y.o. female presents with the above complaint. History confirmed with patient.   Objective:  Physical Exam: warm, good capillary refill, no trophic changes or ulcerative lesions, normal DP and PT pulses, normal sensory exam, and left foot and the plantar second interspace is a small verruca plantaris with disruption of skin lines as well as the plantar medial left hallux, the right forefoot has a diffuse submetatarsal callus 4 and 5.   Assessment:   1. Verruca plantaris      Plan:  Patient was evaluated and treated and all questions answered.   Discussed etiology and treatment of verruca plantaris in detail with the patient as well as multiple treatment options including blistering agents, chemotherapeutic agents, surgical excision, laser therapy and the indications and roles of the above.  Today, recommended treatment with salinocaine as noted in procedure note below.  Follow-up in 1 month for reevaluation.  If not improved by that point would recommend use of trichloroacetic acid  Procedure: Destruction of Lesion Location: Left hallux and forefoot Instrumentation: 15 blade. Technique: Debridement of lesion to petechial bleeding. Aperture pad applied around lesion. Small amount of salinocaine applied to the base of the lesion. Dressing: Adhesive bandage Disposition: Patient tolerated procedure well. Advised to leave dressing on for 24 hours   The diffuse callus on the right foot is secondary to metatarsal pressure I recommended using urea 40% cream for this.  Return in  about 1 month (around 03/31/2023) for wart treatment.

## 2023-04-12 ENCOUNTER — Ambulatory Visit: Payer: Managed Care, Other (non HMO) | Admitting: Podiatry

## 2023-06-01 IMAGING — US US PELVIS COMPLETE WITH TRANSVAGINAL
1 series · 14 of 25 positions shown · non-contrast
Comparison: None

CLINICAL DATA: Menorrhagia, LMP 03/22/2021



[Series 1: us pelvis complete with transvaginal · 0.24mm/px · 14 of 101 slices shown]
[im 1/101]
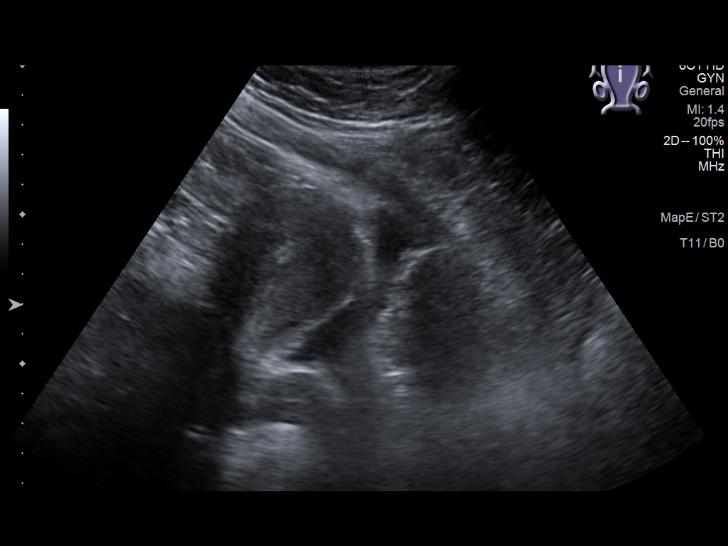
[im 9/101]
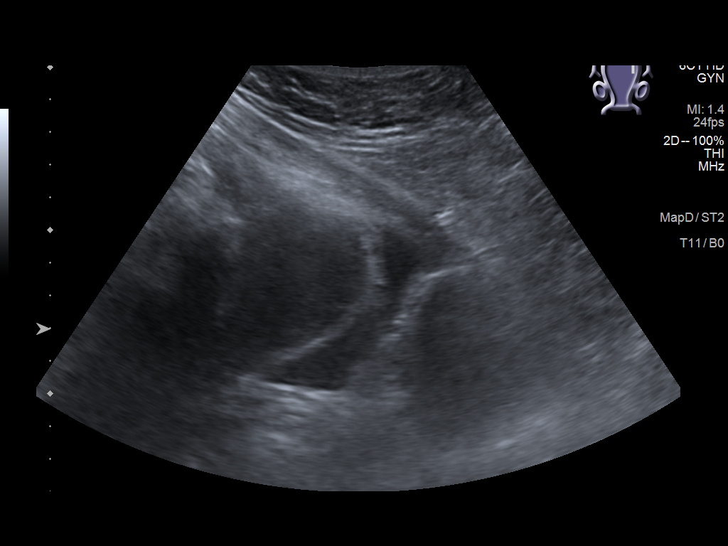
[im 17/101]
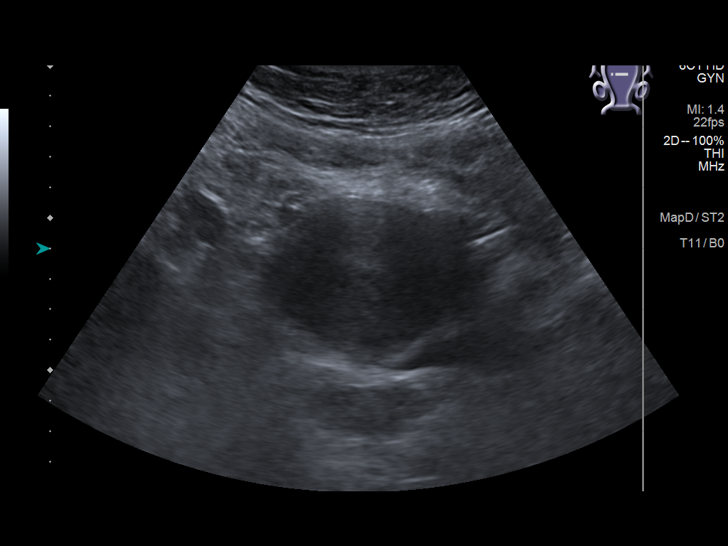
[im 26/101]
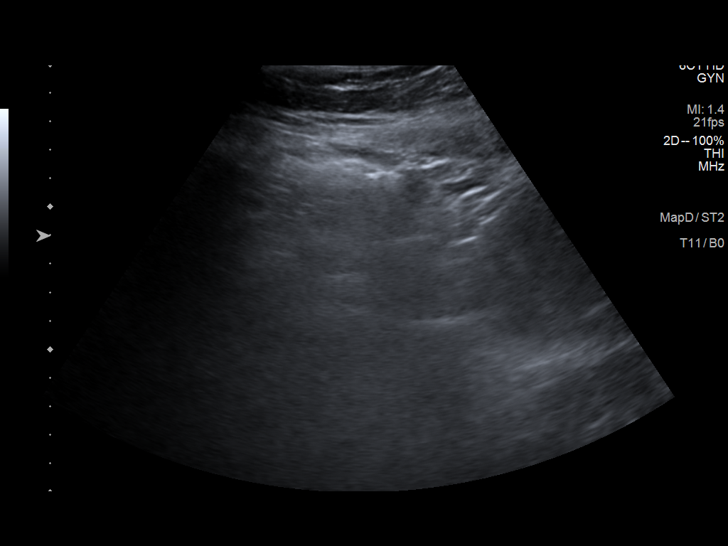
[im 34/101]
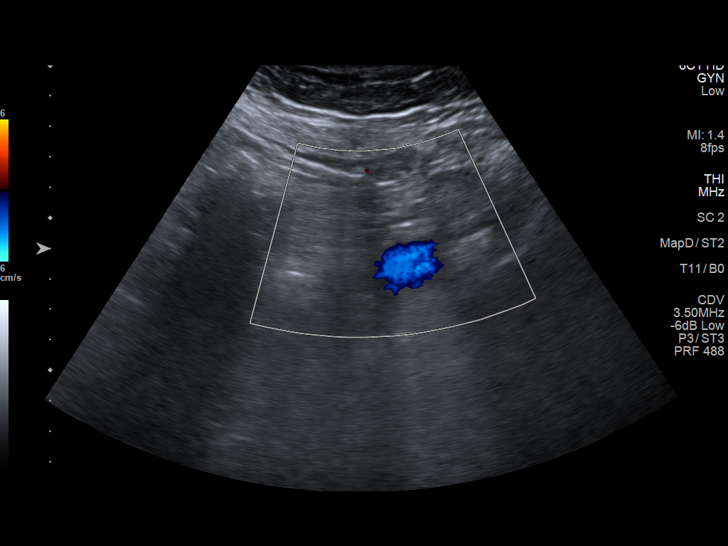
[im 38/101]
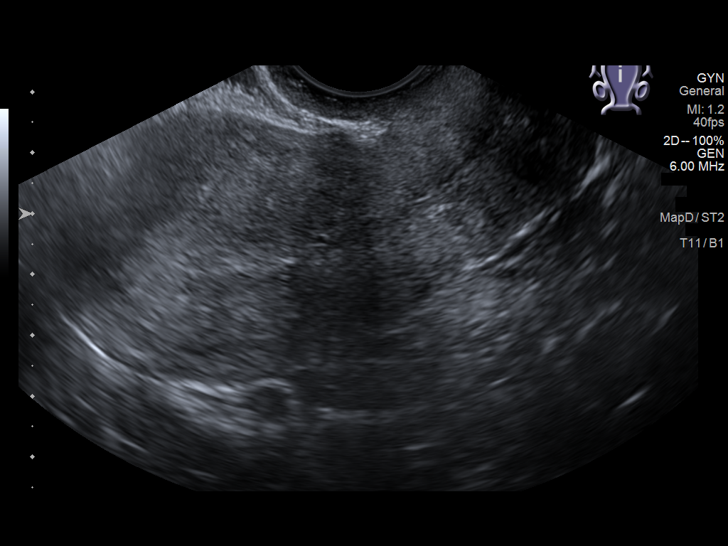
[im 46/101]
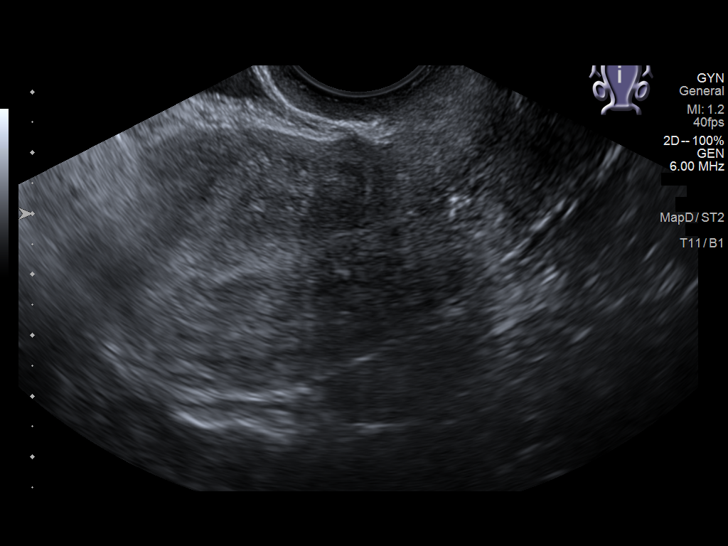
[im 55/101]
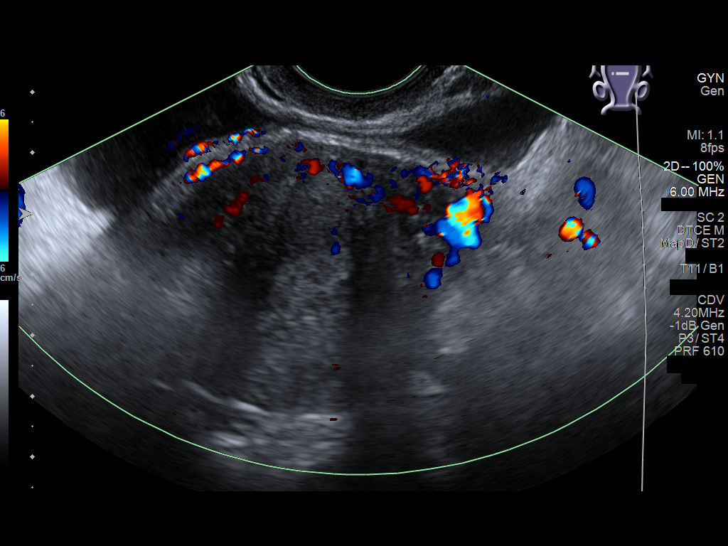
[im 63/101]
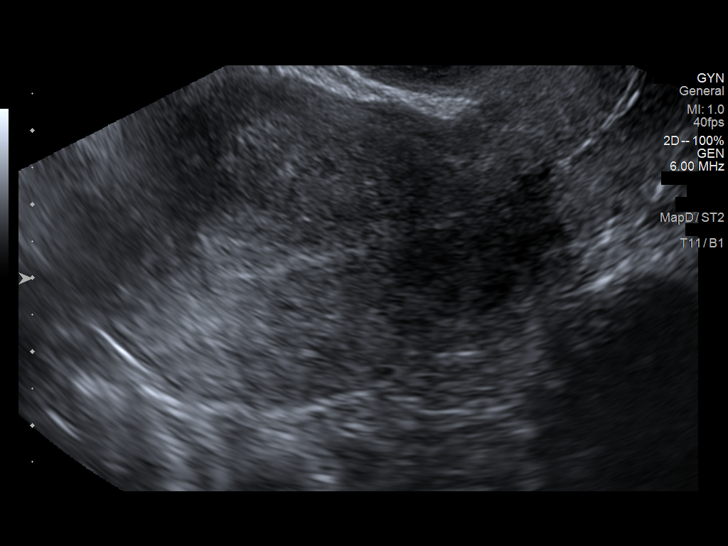
[im 67/101]
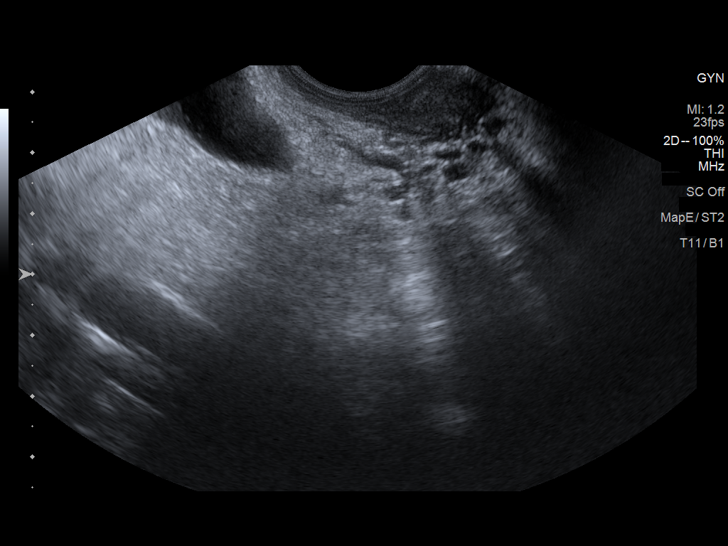
[im 76/101]
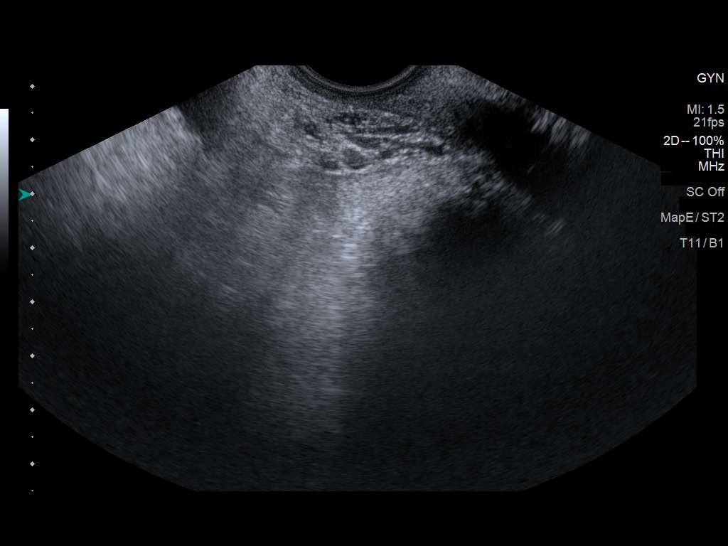
[im 84/101]
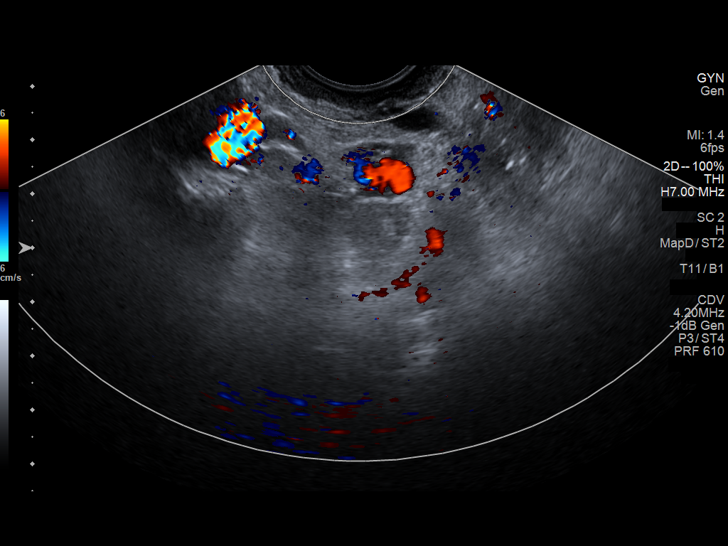
[im 92/101]
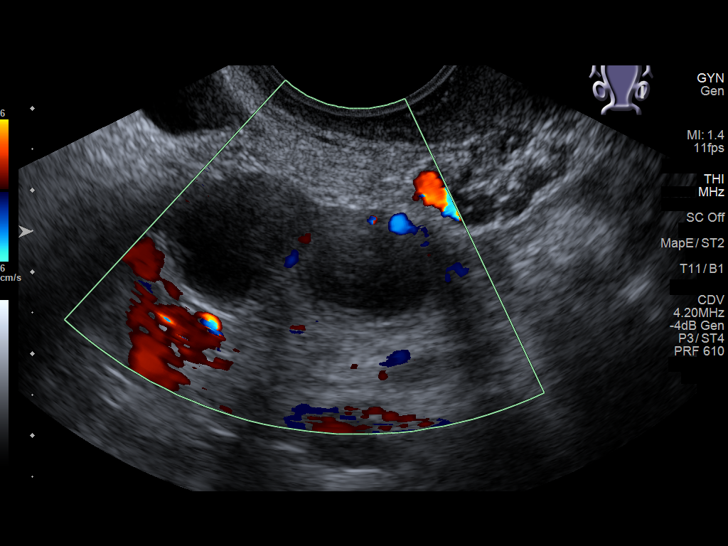
[im 101/101]
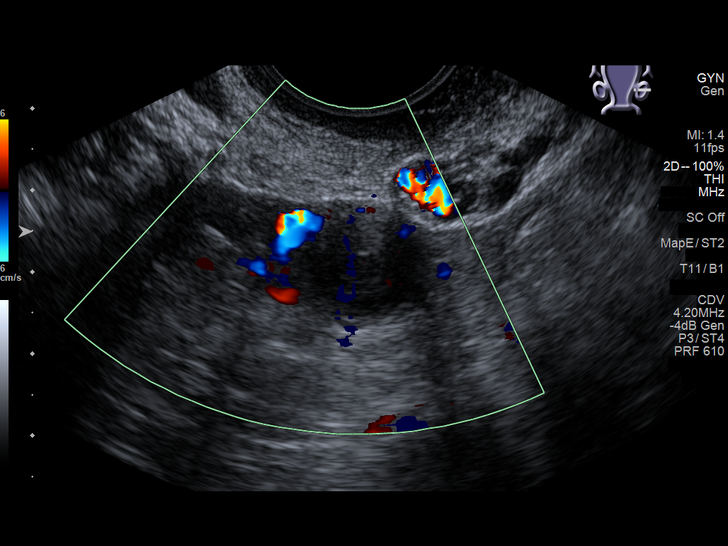

[14 of 25 positions shown; findings below may reference images not displayed]

FINDINGS: Uterus

Measurements: 9.1 x 4.5 x 5.3 cm = volume: 113 mL. Anteverted.
Slightly heterogeneous myometrium. Slight irregularity of the
anterior wall at the inferior uterine segment, question prior
Caesarean section. No focal uterine mass.

Endometrium

Thickness: 11 mm.  No endometrial fluid or mass

Right ovary

Not visualized, likely obscured by bowel

Left ovary

Measurements: 3.7 x 2.1 x 3.8 cm = volume: 15.4 mL. Small corpus
luteum. No follow-up imaging recommended. No additional masses.

Other findings

No free pelvic fluid.  No adnexal masses.
IMPRESSION: Nonvisualization of RIGHT ovary.

Otherwise negative exam.

## 2024-02-08 ENCOUNTER — Other Ambulatory Visit: Payer: Self-pay | Admitting: Family Medicine

## 2024-02-08 DIAGNOSIS — Z1231 Encounter for screening mammogram for malignant neoplasm of breast: Secondary | ICD-10-CM

## 2024-03-27 ENCOUNTER — Ambulatory Visit
Admission: RE | Admit: 2024-03-27 | Discharge: 2024-03-27 | Disposition: A | Discharge status: Home / Self Care | Source: Ambulatory Visit | Attending: Family Medicine | Admitting: Family Medicine

## 2024-03-27 DIAGNOSIS — Z1231 Encounter for screening mammogram for malignant neoplasm of breast: Secondary | ICD-10-CM | POA: Insufficient documentation
